# Patient Record
Sex: Female | Born: 1955 | Race: White | Hispanic: No | Marital: Single | Smoking: Never smoker
Health system: Southern US, Community
[De-identification: ages and names within clinical notes are randomized; demographics above are authoritative.]

## PROBLEM LIST (undated history)

## (undated) DIAGNOSIS — I10 Essential (primary) hypertension: Secondary | ICD-10-CM

## (undated) DIAGNOSIS — E7800 Pure hypercholesterolemia, unspecified: Secondary | ICD-10-CM

## (undated) DIAGNOSIS — K219 Gastro-esophageal reflux disease without esophagitis: Secondary | ICD-10-CM

## (undated) HISTORY — PX: CHOLECYSTECTOMY: SHX55

## (undated) HISTORY — PX: OTHER SURGICAL HISTORY: SHX169

---

## 2013-04-22 ENCOUNTER — Encounter (HOSPITAL_COMMUNITY): Payer: Self-pay | Admitting: Family Medicine

## 2013-04-22 ENCOUNTER — Emergency Department (HOSPITAL_COMMUNITY)
Admission: EM | Admit: 2013-04-22 | Discharge: 2013-04-22 | Disposition: A | Discharge status: Home / Self Care | Payer: BC Managed Care – PPO | Attending: Emergency Medicine | Admitting: Emergency Medicine

## 2013-04-22 DIAGNOSIS — Z8639 Personal history of other endocrine, nutritional and metabolic disease: Secondary | ICD-10-CM | POA: Insufficient documentation

## 2013-04-22 DIAGNOSIS — Y9389 Activity, other specified: Secondary | ICD-10-CM | POA: Insufficient documentation

## 2013-04-22 DIAGNOSIS — Z862 Personal history of diseases of the blood and blood-forming organs and certain disorders involving the immune mechanism: Secondary | ICD-10-CM | POA: Insufficient documentation

## 2013-04-22 DIAGNOSIS — Y929 Unspecified place or not applicable: Secondary | ICD-10-CM | POA: Insufficient documentation

## 2013-04-22 DIAGNOSIS — Z8719 Personal history of other diseases of the digestive system: Secondary | ICD-10-CM | POA: Insufficient documentation

## 2013-04-22 DIAGNOSIS — I10 Essential (primary) hypertension: Secondary | ICD-10-CM | POA: Insufficient documentation

## 2013-04-22 DIAGNOSIS — S0191XA Laceration without foreign body of unspecified part of head, initial encounter: Secondary | ICD-10-CM

## 2013-04-22 DIAGNOSIS — W208XXA Other cause of strike by thrown, projected or falling object, initial encounter: Secondary | ICD-10-CM | POA: Insufficient documentation

## 2013-04-22 DIAGNOSIS — S0180XA Unspecified open wound of other part of head, initial encounter: Secondary | ICD-10-CM | POA: Insufficient documentation

## 2013-04-22 HISTORY — DX: Essential (primary) hypertension: I10

## 2013-04-22 HISTORY — DX: Pure hypercholesterolemia, unspecified: E78.00

## 2013-04-22 HISTORY — DX: Gastro-esophageal reflux disease without esophagitis: K21.9

## 2013-04-22 NOTE — ED Notes (Signed)
Pt sts she was loading a book shelf and it fell on her and hit her head. Pt laceration to  Left side of head. Bleeding controlled. Denies LOC.

## 2013-04-22 NOTE — ED Provider Notes (Signed)
History    This chart was scribed for non-physician practitioner working with Loren Racer, MD by Smitty Pluck, ED scribe. This patient was seen in room TR05C/TR05C and the patient's care was started at 6:45 PM.   CSN: [id number]  Arrival date & time 04/22/13  1831    Chief Complaint  Patient presents with  . Head Laceration     The history is provided by the patient and medical records. No language interpreter was used.   HPI Comments: Janice Meyers is a 57 y.o. female who presents to the Emergency Department complaining of laceration to left side of face due to bookshelf falling backwards onto her today. She reports that she was moving the bookshelf off of truck today when laceration occurred. Bleeding is controlled. Pt denies LOC, blurred vision, fever, chills, nausea, vomiting, diarrhea, weakness, cough, SOB and any other pain. Tetanus is UTD.    Past Medical History  Diagnosis Date  . Hypertension   . High cholesterol   . Acid reflux     Past Surgical History  Procedure Laterality Date  . Cholecystectomy    . Cervial fusion      History reviewed. No pertinent family history.  History  Substance Use Topics  . Smoking status: Never Smoker   . Smokeless tobacco: Not on file  . Alcohol Use: No    OB History   Grav Para Term Preterm Abortions TAB SAB Ect Mult Living                  Review of Systems  Constitutional: Negative for fever and chills.  Gastrointestinal: Negative for nausea and vomiting.  Neurological: Negative for weakness.    Allergies  Aspirin and Codeine  Home Medications  No current outpatient prescriptions on file.  BP 152/78  Pulse 99  Temp(Src) 98.2 F (36.8 C)  Resp 18  SpO2 95%  Physical Exam  Nursing note and vitals reviewed. Constitutional: She is oriented to person, place, and time. She appears well-developed and well-nourished. No distress.  HENT:  Head: Normocephalic.  2 cm linear laceration on left forehead   Bleeding is controlled.   Eyes: Conjunctivae and EOM are normal. Pupils are equal, round, and reactive to light.  Neck: Neck supple. No tracheal deviation present.  Cardiovascular: Normal rate.   Pulmonary/Chest: Effort normal. No respiratory distress.  Musculoskeletal: Normal range of motion.  Neurological: She is alert and oriented to person, place, and time.  Skin: Skin is warm and dry.  Psychiatric: She has a normal mood and affect. Her behavior is normal.    ED Course  Procedures (including critical care time) DIAGNOSTIC STUDIES: Oxygen Saturation is 95% on room air, adequate by my interpretation.    COORDINATION OF CARE: 6:47 PM Discussed ED treatment with pt and pt agrees.    LACERATION REPAIR Performed by: Teressa Lower NP Consent: Verbal consent obtained. Risks and benefits: risks, benefits and alternatives were discussed Patient identity confirmed: provided demographic data Time out performed prior to procedure Prepped and Draped in normal sterile fashion Wound explored Laceration Location: left forehead Laceration Length: 2cm No Foreign Bodies seen or palpated Local anesthetic:none Anesthetic total: none Amount of cleaning: standard Skin closure: yes Number of sutures or staples: Dermabond  Technique: simple Patient tolerance: Patient tolerated the procedure well with no immediate complications.   Labs Reviewed - No data to display No results found.   1. Laceration of head, initial encounter       MDM  Wound repaired without any  problem:tetanus is utd  I personally performed the services described in this documentation, which was scribed in my presence. The recorded information has been reviewed and is accurate.    Teressa Lower, NP 04/22/13 [phone]

## 2013-04-23 NOTE — ED Provider Notes (Signed)
Medical screening examination/treatment/procedure(s) were performed by non-physician practitioner and as supervising physician I was immediately available for consultation/collaboration.   Fannie Gathright, MD 04/23/13 0220 

## 2017-04-20 ENCOUNTER — Ambulatory Visit (INDEPENDENT_AMBULATORY_CARE_PROVIDER_SITE_OTHER): Payer: Self-pay | Admitting: Orthopedic Surgery

## 2018-02-10 ENCOUNTER — Encounter (HOSPITAL_BASED_OUTPATIENT_CLINIC_OR_DEPARTMENT_OTHER): Payer: Self-pay | Admitting: Emergency Medicine

## 2018-02-10 ENCOUNTER — Other Ambulatory Visit: Payer: Self-pay

## 2018-02-10 ENCOUNTER — Emergency Department (HOSPITAL_BASED_OUTPATIENT_CLINIC_OR_DEPARTMENT_OTHER)
Admission: EM | Admit: 2018-02-10 | Discharge: 2018-02-10 | Disposition: A | Discharge status: Home / Self Care | Payer: Worker's Compensation | Attending: Emergency Medicine | Admitting: Emergency Medicine

## 2018-02-10 ENCOUNTER — Emergency Department (HOSPITAL_BASED_OUTPATIENT_CLINIC_OR_DEPARTMENT_OTHER): Payer: Worker's Compensation

## 2018-02-10 DIAGNOSIS — Y939 Activity, unspecified: Secondary | ICD-10-CM | POA: Diagnosis not present

## 2018-02-10 DIAGNOSIS — S9002XA Contusion of left ankle, initial encounter: Secondary | ICD-10-CM | POA: Insufficient documentation

## 2018-02-10 DIAGNOSIS — Y990 Civilian activity done for income or pay: Secondary | ICD-10-CM | POA: Diagnosis not present

## 2018-02-10 DIAGNOSIS — Z79899 Other long term (current) drug therapy: Secondary | ICD-10-CM | POA: Diagnosis not present

## 2018-02-10 DIAGNOSIS — I10 Essential (primary) hypertension: Secondary | ICD-10-CM | POA: Insufficient documentation

## 2018-02-10 DIAGNOSIS — W208XXA Other cause of strike by thrown, projected or falling object, initial encounter: Secondary | ICD-10-CM | POA: Insufficient documentation

## 2018-02-10 DIAGNOSIS — Y92513 Shop (commercial) as the place of occurrence of the external cause: Secondary | ICD-10-CM | POA: Insufficient documentation

## 2018-02-10 DIAGNOSIS — T1490XA Injury, unspecified, initial encounter: Secondary | ICD-10-CM

## 2018-02-10 DIAGNOSIS — S99911A Unspecified injury of right ankle, initial encounter: Secondary | ICD-10-CM | POA: Diagnosis present

## 2018-02-10 DIAGNOSIS — S9001XA Contusion of right ankle, initial encounter: Secondary | ICD-10-CM

## 2018-02-10 NOTE — ED Triage Notes (Signed)
L lower leg pain. States a bottle of cleaner fell onto it at work yesterday.

## 2018-02-10 NOTE — Discharge Instructions (Signed)
Elevate your foot whenever possible. Ace wrap.  Compression can limit swelling and pain. Motrin and/or Tylenol for pain.

## 2018-02-10 NOTE — ED Notes (Addendum)
Pt states she has not taken her evening b/p medications. MD aware that her b/p is elevated at d/c home and no further orders received.

## 2018-02-10 NOTE — ED Notes (Signed)
Ice pack given at d/c and sprite

## 2018-02-10 NOTE — ED Provider Notes (Signed)
[facility] EMERGENCY DEPARTMENT Provider Note   CSN: [id number] Arrival date & time: 02/10/18  1808     History   Chief Complaint Chief Complaint  Patient presents with  . Leg Pain    HPI Janice Meyers is a 62 y.o. female.  Chief complaint is ankle and foot pain and swelling  HPI this is a 62 year old female.  She states that she was moving a box yesterday and there was a bottle of cleaner on it.  Cleaner fell and struck on the top of her foot and ankle.  States that she was really concerned because it is markedly swollen, black and blue, and she is walking with a limp today.  States she is really surprised because it "was not that heavy".  Did not twist her ankle or have any other injury when attempting to move from the following object.  Past Medical History:  Diagnosis Date  . Acid reflux   . High cholesterol   . Hypertension     There are no active problems to display for this patient.   Past Surgical History:  Procedure Laterality Date  . cervial fusion    . CHOLECYSTECTOMY       OB History   None      Home Medications    Prior to Admission medications   Medication Sig Start Date End Date Taking? Authorizing Provider  hydrochlorothiazide (HYDRODIURIL) 25 MG tablet Take 25 mg by mouth every evening.    [provider]  metoprolol succinate (TOPROL-XL) 25 MG 24 hr tablet Take 25 mg by mouth every evening.    [provider]    Family History No family history on file.  Social History Social History   Tobacco Use  . Smoking status: Never Smoker  . Smokeless tobacco: Never Used  Substance Use Topics  . Alcohol use: No  . Drug use: Not on file     Allergies   Aspirin and Codeine   Review of Systems Review of Systems  Constitutional: Negative for appetite change, chills, diaphoresis, fatigue and fever.  HENT: Negative for mouth sores, sore throat and trouble swallowing.   Eyes: Negative for visual disturbance.    Respiratory: Negative for cough, chest tightness, shortness of breath and wheezing.   Cardiovascular: Negative for chest pain.  Gastrointestinal: Negative for abdominal distention, abdominal pain, diarrhea, nausea and vomiting.  Endocrine: Negative for polydipsia, polyphagia and polyuria.  Genitourinary: Negative for dysuria, frequency and hematuria.  Musculoskeletal: Negative for gait problem.       Foot pain and swelling.  Skin: Negative for color change, pallor and rash.  Neurological: Negative for dizziness, syncope, light-headedness and headaches.  Hematological: Does not bruise/bleed easily.  Psychiatric/Behavioral: Negative for behavioral problems and confusion.     Physical Exam Updated Vital Signs BP (!) 163/94   Pulse 75   Temp 98.9 F (37.2 C) (Oral)   Resp 16   Ht 5\' 5"  (1.651 m)   Wt 63 kg (139 lb)   SpO2 100%   BMI 23.13 kg/m   Physical Exam  Musculoskeletal:  Fairly extensive bruising and soft tissue swelling in the dorsum of the foot proximally and lateral ankle.  Some tenderness over the malleoli.  Perfusion.  Good cap refill.  Good sensation.     ED Treatments / Results  Labs (all labs ordered are listed, but only abnormal results are displayed) Labs Reviewed - No data to display  EKG None  Radiology Dg Ankle Complete Left  Result  Date: 02/10/2018 CLINICAL DATA:  Blunt traumatic injury to the left ankle and foot yesterday. EXAM: LEFT ANKLE COMPLETE - 3+ VIEW COMPARISON:  None. FINDINGS: No fracture or subluxation. No suspicious focal osseous lesion. Small Achilles and plantar left calcaneal spurs. No radiopaque foreign body. Mild diffuse left ankle soft tissue swelling. IMPRESSION: Mild diffuse left ankle soft tissue swelling, with no fracture or subluxation. Electronically Signed   By: Delbert Phenix M.D.   On: 02/10/2018 21:45   Dg Foot Complete Left  Result Date: 02/10/2018 CLINICAL DATA:  Blunt traumatic injury to the left foot yesterday. EXAM:  LEFT FOOT - COMPLETE 3+ VIEW COMPARISON:  None. FINDINGS: No left foot fracture or dislocation. Mild hallux valgus deformity. Suggestion of pes planus deformity. Small Achilles and plantar left calcaneal spurs. No significant arthropathy. No radiopaque foreign body. IMPRESSION: No left foot fracture or dislocation.  Mild hallux valgus deformity. Electronically Signed   By: Delbert Phenix M.D.   On: 02/10/2018 21:47    Procedures Procedures (including critical care time)  Medications Ordered in ED Medications - No data to display   Initial Impression / Assessment and Plan / ED Course  I have reviewed the triage vital signs and the nursing notes.  Pertinent labs & imaging results that were available during my care of the patient were reviewed by me and considered in my medical decision making (see chart for details).    Mechanism but soft tissue swelling and ecchymosis mid pain.  X-rays obtained and no fracture noted.  She works at [employer] which is ongoing currently.  She was on her feet for more than 15 hours yesterday.  I think this is likely why she is having exaggerated symptoms from her bruising and swelling.  Ace wrap applied.  Elevation.  Motrin Tylenol.  Recheck as needed.  Final Clinical Impressions(s) / ED Diagnoses   Final diagnoses:  Contusion of right ankle, initial encounter    ED Discharge Orders    None       Rolland Porter, MD 02/10/18 2213

## 2018-02-10 NOTE — ED Notes (Signed)
Patient ambulated to restroom.

## 2019-01-29 IMAGING — DX DG FOOT COMPLETE 3+V*L*
3 series · 3 of 3 positions shown · non-contrast
Comparison: None.

CLINICAL DATA: Blunt traumatic injury to the left foot yesterday.

EXAM:
LEFT FOOT - COMPLETE 3+ VIEW

[foot ap]
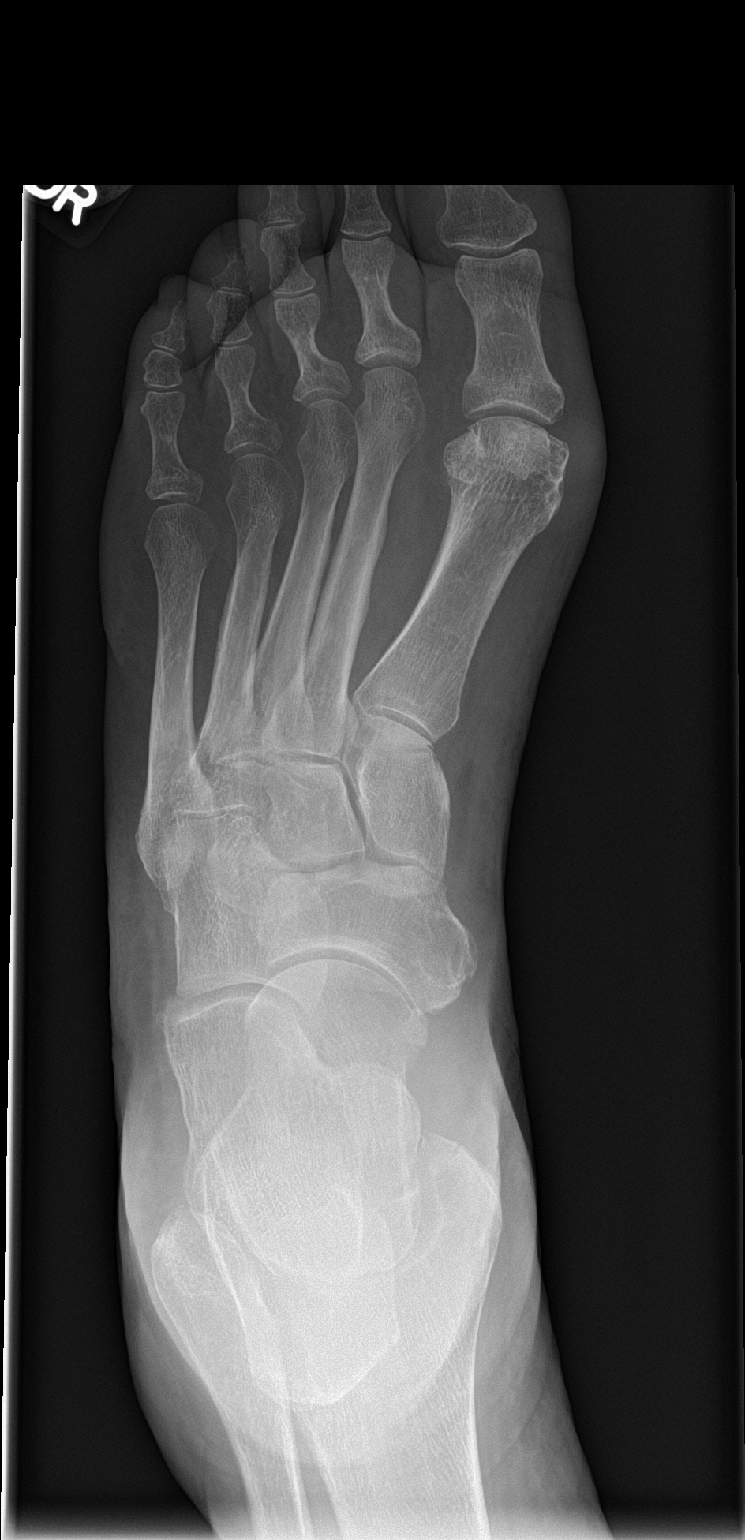

[foot obl]
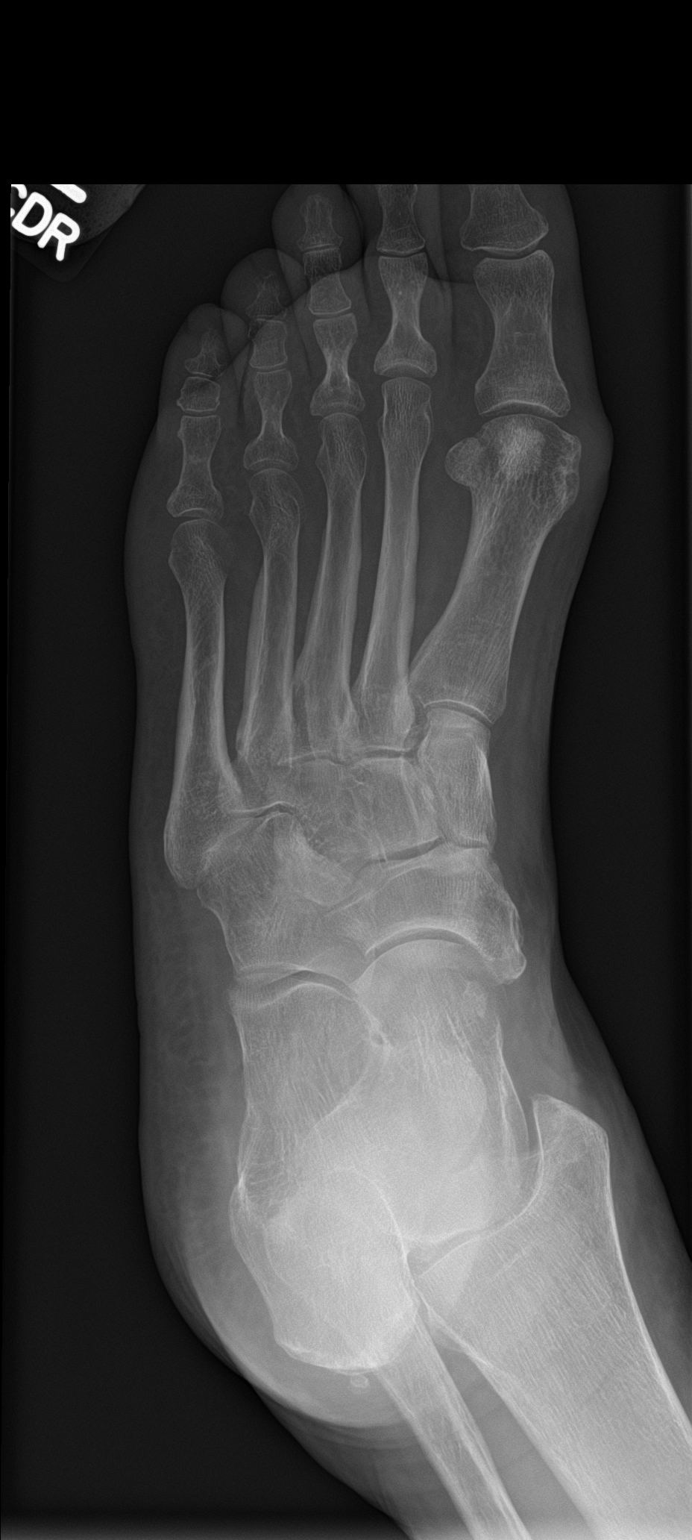

[foot lat]
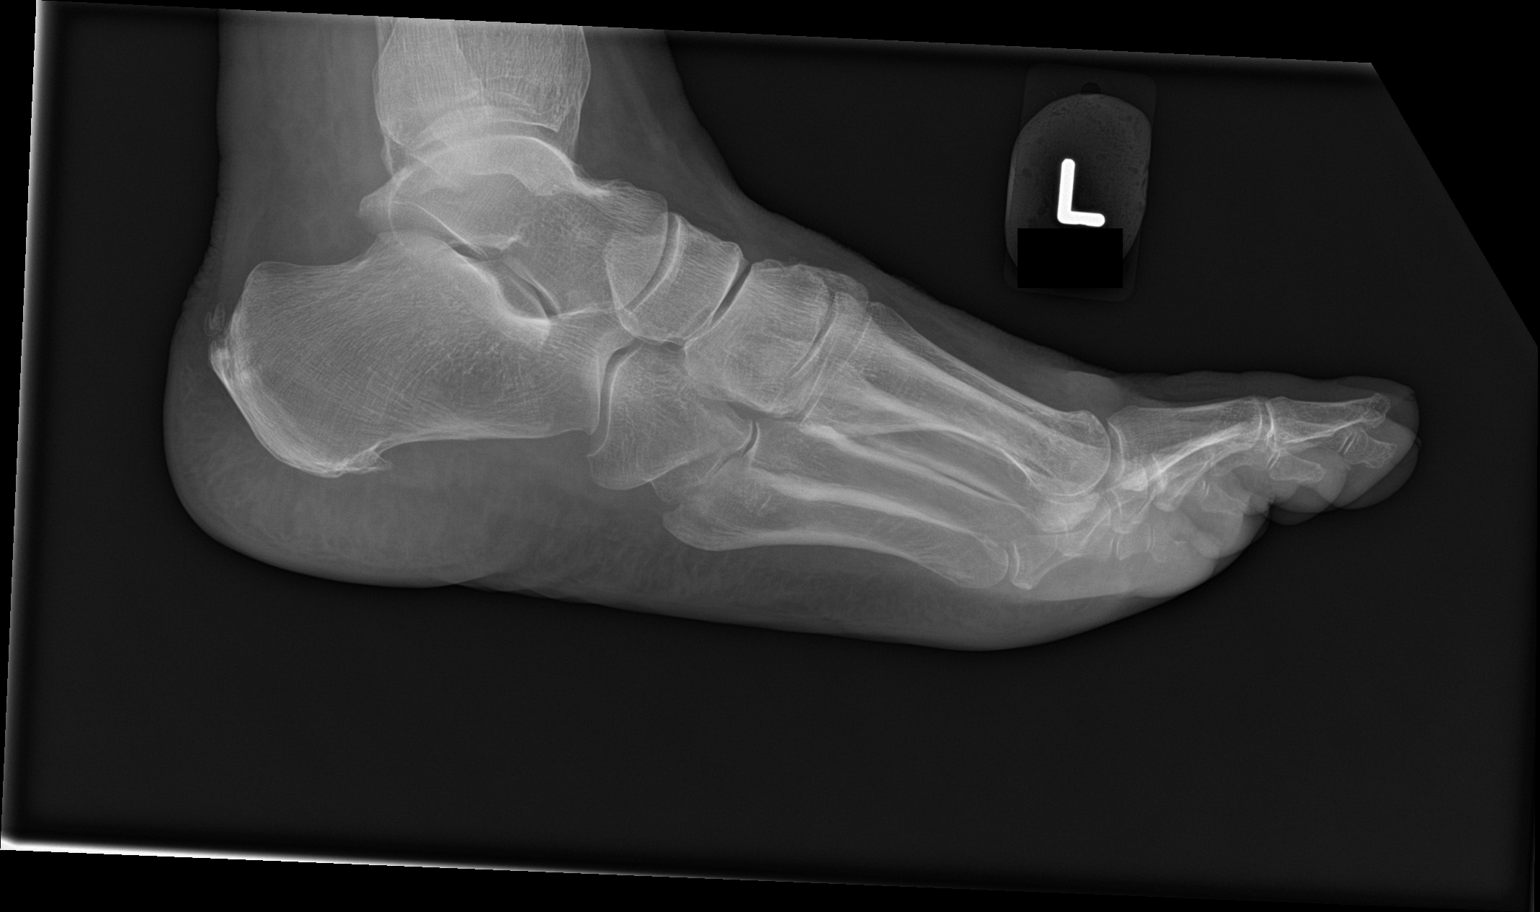

[3 of 3 positions shown; findings below may reference images not displayed]

FINDINGS: No left foot fracture or dislocation. Mild hallux valgus deformity.
Suggestion of pes planus deformity. Small Achilles and plantar left
calcaneal spurs. No significant arthropathy. No radiopaque foreign
body.
IMPRESSION: No left foot fracture or dislocation.  Mild hallux valgus deformity.

## 2019-01-29 IMAGING — DX DG ANKLE COMPLETE 3+V*L*
3 series · 3 of 3 positions shown · non-contrast
Comparison: None.

CLINICAL DATA: Blunt traumatic injury to the left ankle and foot
yesterday.

EXAM:
LEFT ANKLE COMPLETE - 3+ VIEW

[ankle ap]
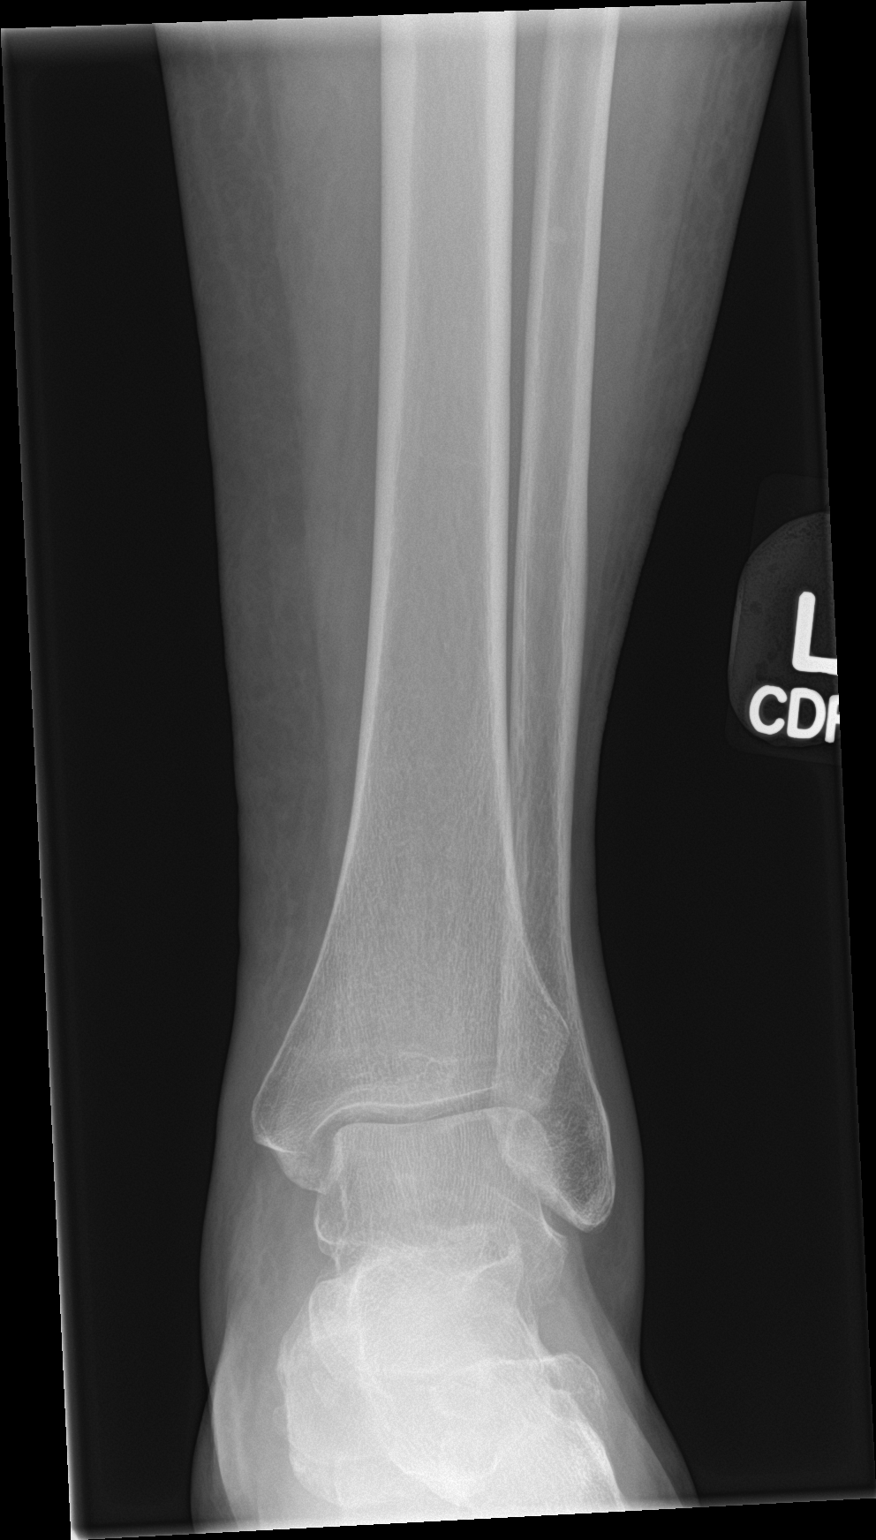

[ankle obl]
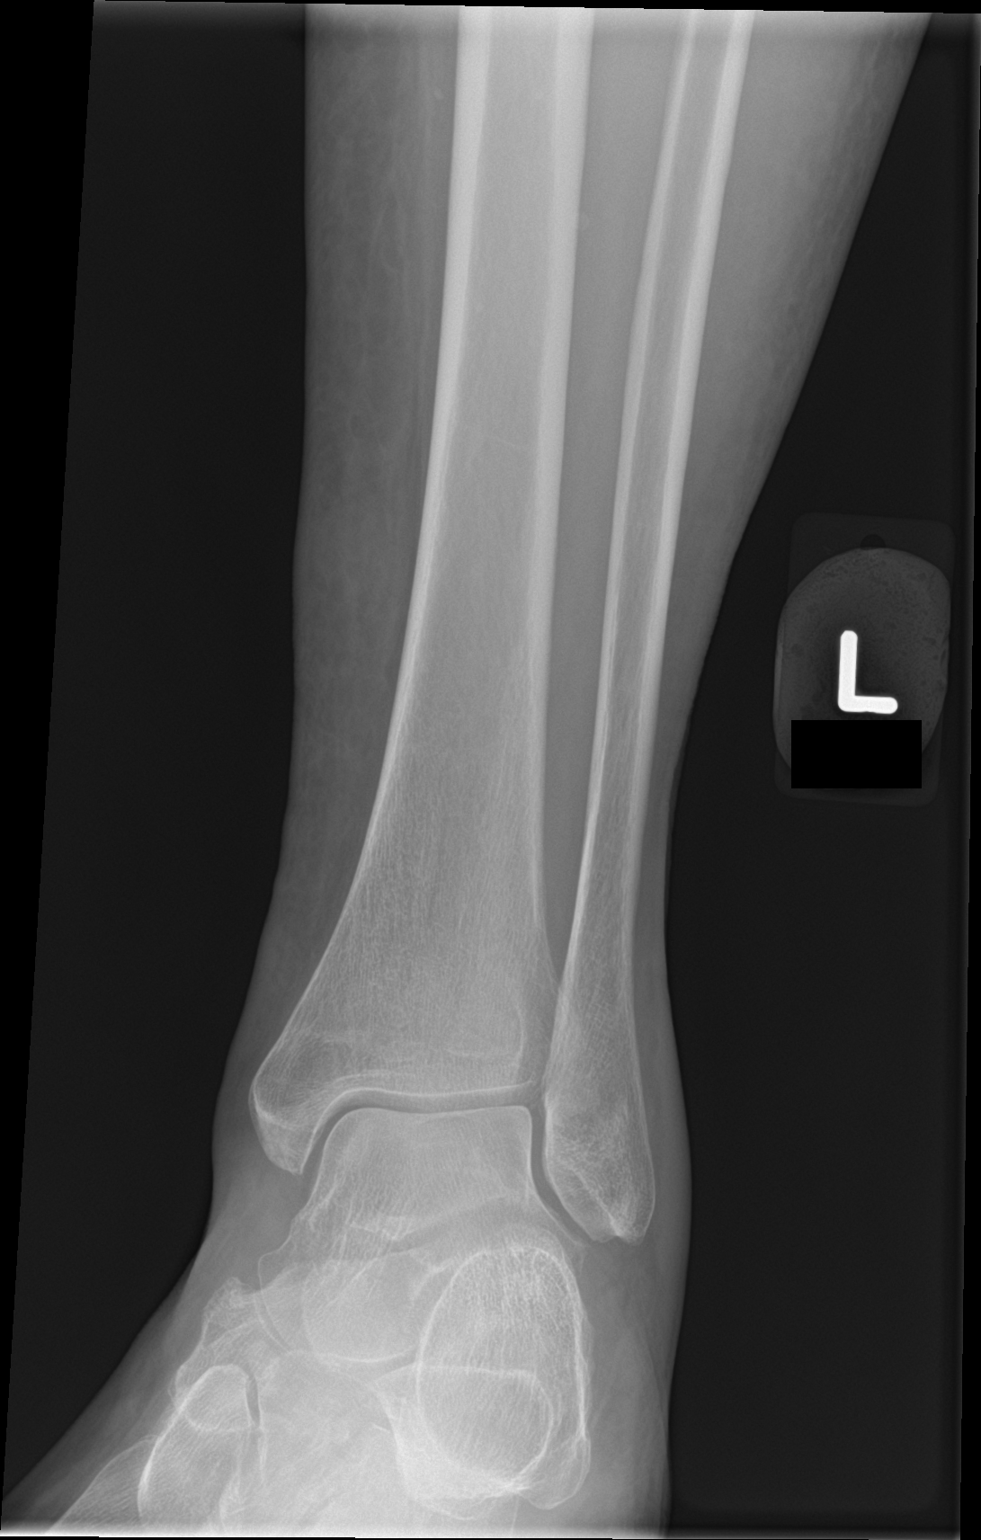

[ankle lat]
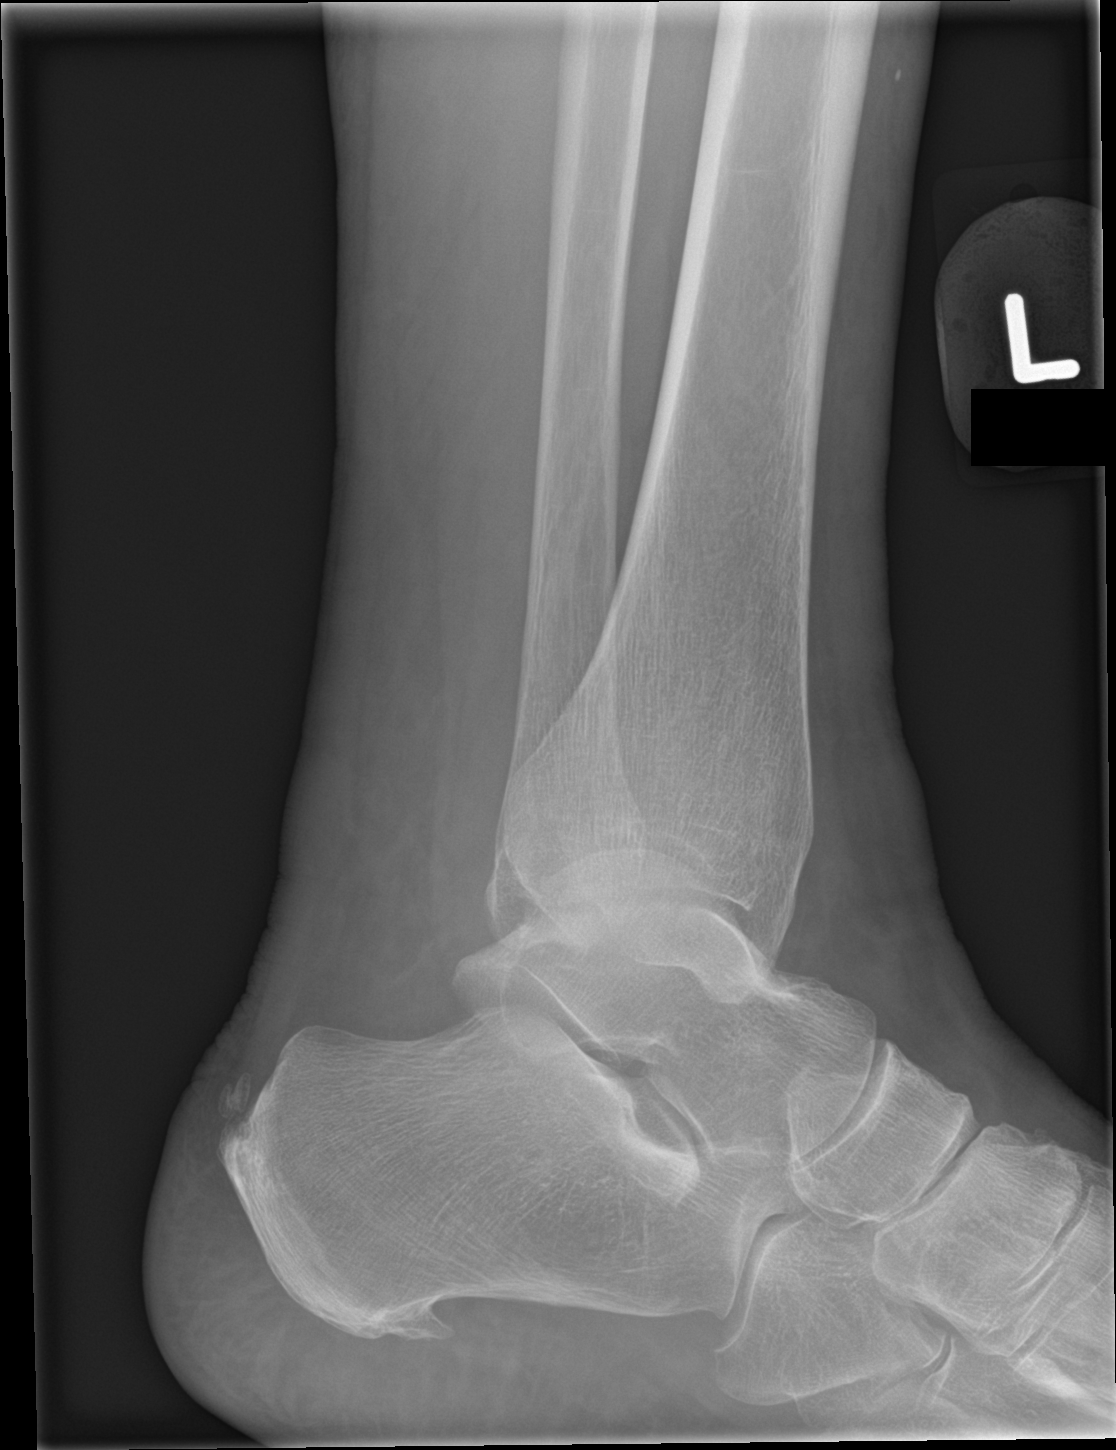

[3 of 3 positions shown; findings below may reference images not displayed]

FINDINGS: No fracture or subluxation. No suspicious focal osseous lesion.
Small Achilles and plantar left calcaneal spurs. No radiopaque
foreign body. Mild diffuse left ankle soft tissue swelling.
IMPRESSION: Mild diffuse left ankle soft tissue swelling, with no fracture or
subluxation.

## 2023-10-27 ENCOUNTER — Ambulatory Visit: Payer: Self-pay | Admitting: Nurse Practitioner

## 2024-05-01 ENCOUNTER — Other Ambulatory Visit (HOSPITAL_COMMUNITY): Payer: Self-pay
